# Patient Record
Sex: Male | Born: 1975 | Race: Black or African American | Hispanic: No | Marital: Married | Smoking: Never smoker
Health system: Southern US, Community
[De-identification: ages and names within clinical notes are randomized; demographics above are authoritative.]

## PROBLEM LIST (undated history)

## (undated) DIAGNOSIS — E119 Type 2 diabetes mellitus without complications: Secondary | ICD-10-CM

---

## 2000-03-10 ENCOUNTER — Encounter: Payer: Self-pay | Admitting: Emergency Medicine

## 2000-03-10 ENCOUNTER — Emergency Department (HOSPITAL_COMMUNITY): Admission: EM | Admit: 2000-03-10 | Discharge: 2000-03-10 | Payer: Self-pay | Admitting: Emergency Medicine

## 2000-03-18 ENCOUNTER — Emergency Department (HOSPITAL_COMMUNITY): Admission: EM | Admit: 2000-03-18 | Discharge: 2000-03-18 | Payer: Self-pay | Admitting: Emergency Medicine

## 2000-03-18 ENCOUNTER — Encounter: Payer: Self-pay | Admitting: Emergency Medicine

## 2008-06-26 ENCOUNTER — Emergency Department (HOSPITAL_COMMUNITY): Admission: EM | Admit: 2008-06-26 | Discharge: 2008-06-26 | Payer: Self-pay | Admitting: Emergency Medicine

## 2009-07-05 ENCOUNTER — Emergency Department (HOSPITAL_COMMUNITY)
Admission: EM | Admit: 2009-07-05 | Discharge: 2009-07-05 | Payer: Self-pay | Source: Home / Self Care | Admitting: Emergency Medicine

## 2009-07-12 ENCOUNTER — Emergency Department (HOSPITAL_COMMUNITY): Admission: EM | Admit: 2009-07-12 | Discharge: 2009-07-12 | Payer: Self-pay | Admitting: Emergency Medicine

## 2011-09-25 IMAGING — CR DG HAND COMPLETE 3+V*R*
3 series · 3 of 3 positions shown · non-contrast
Comparison: None

CLINICAL DATA: Laceration to right middle finger by metal

RIGHT HAND - COMPLETE 3+ VIEW

[x hand ap right]
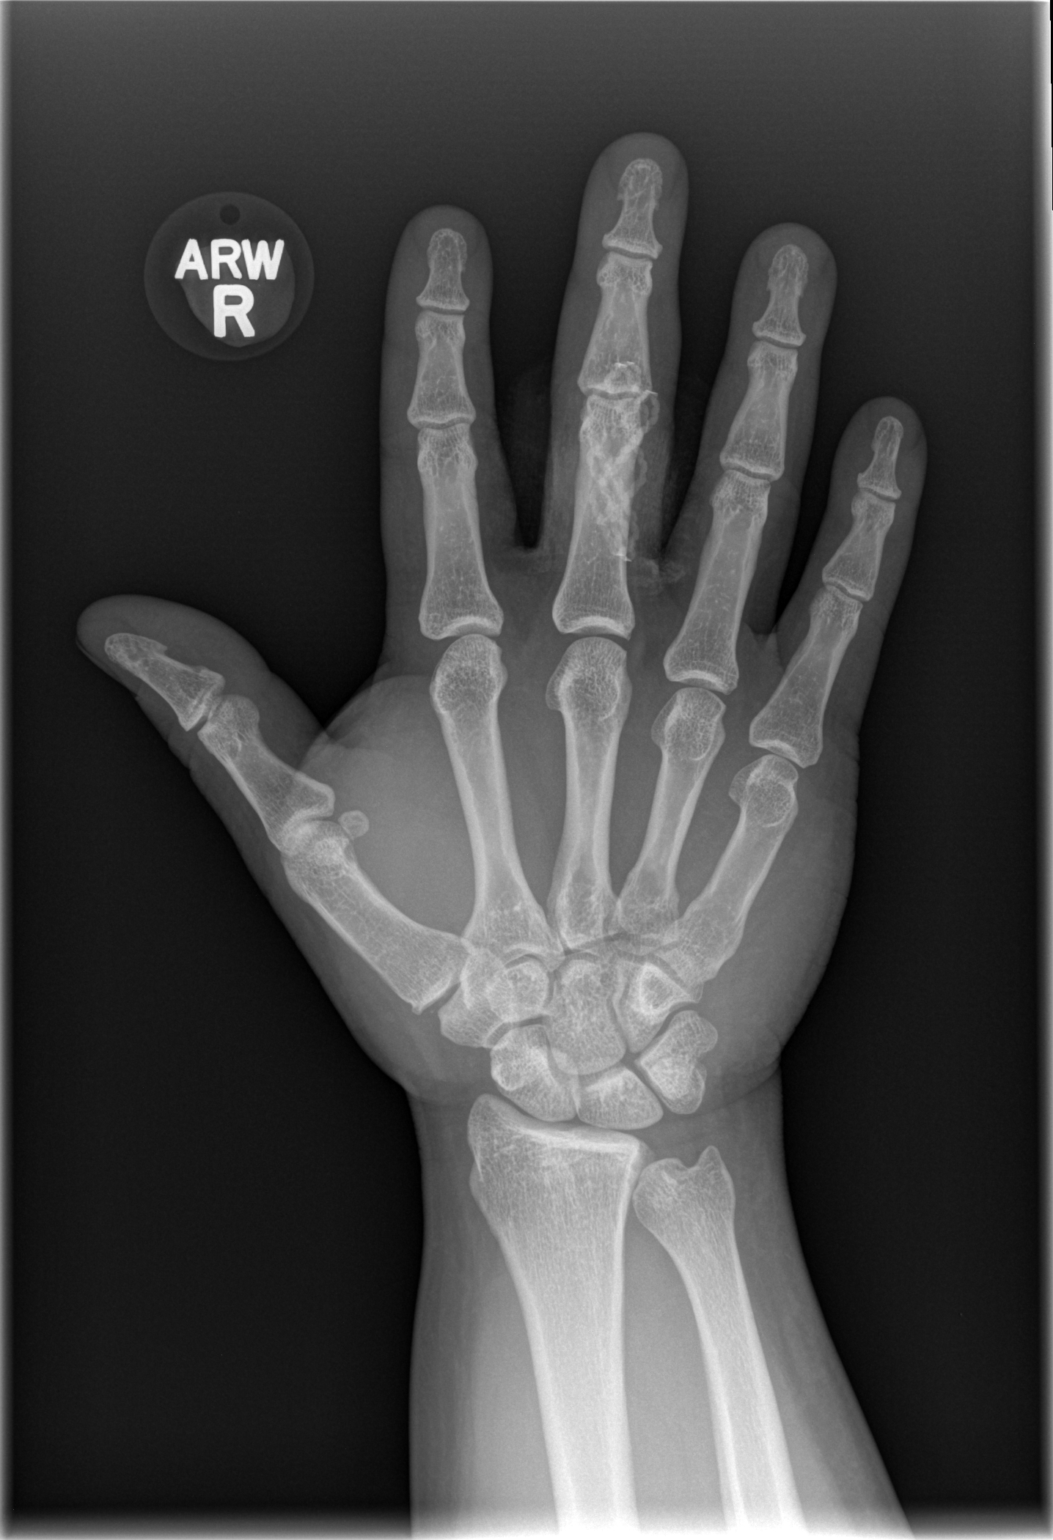

[x hand oblique right]
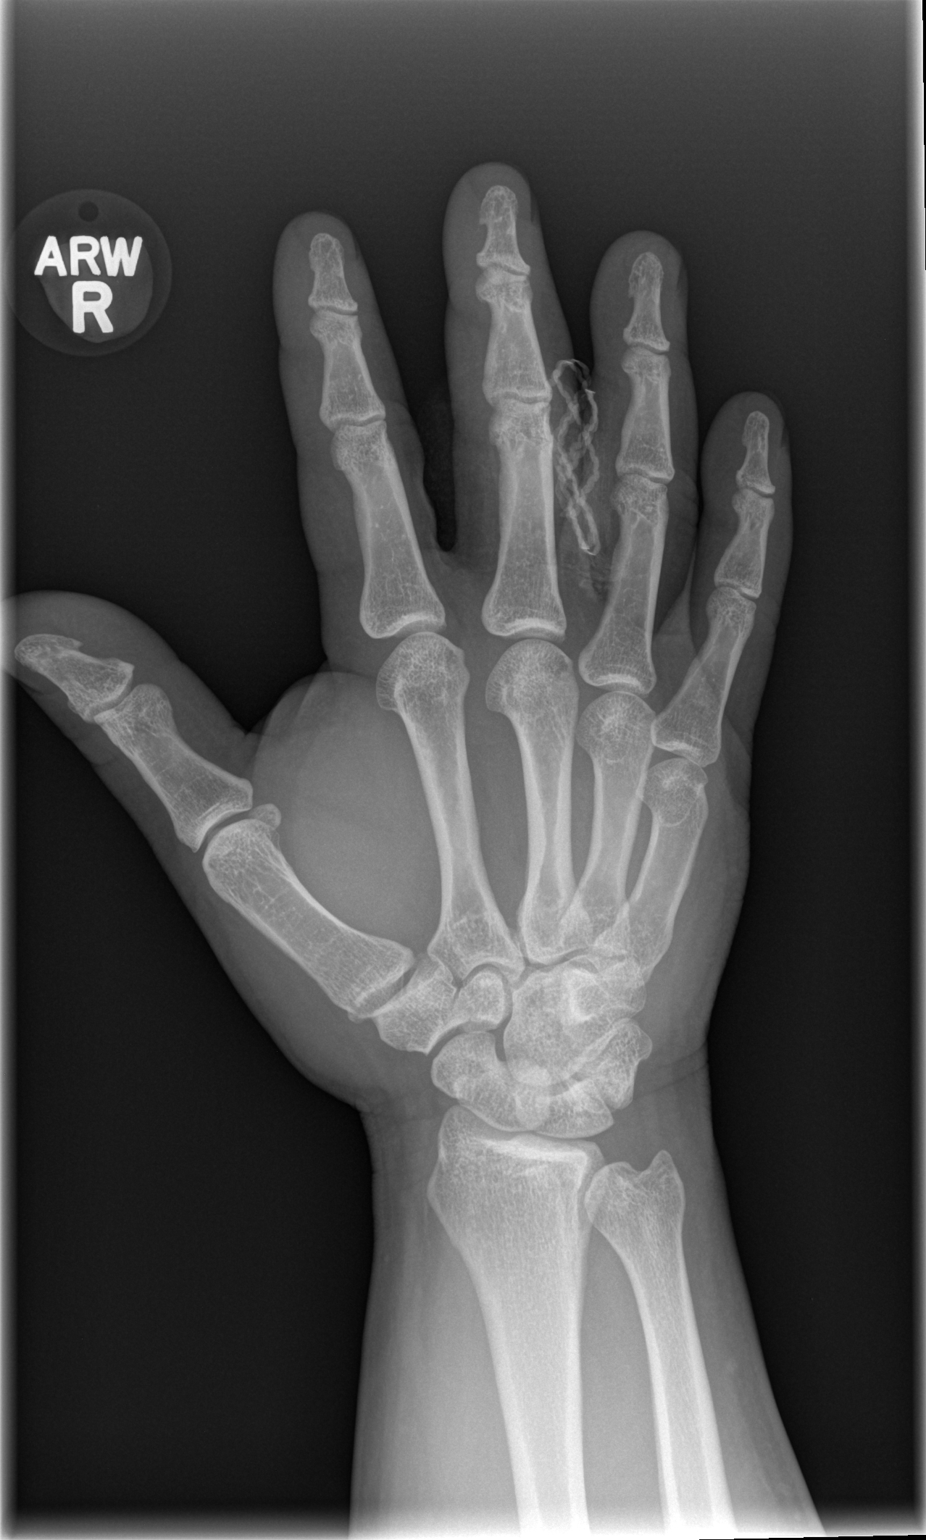

[x hand lat right]
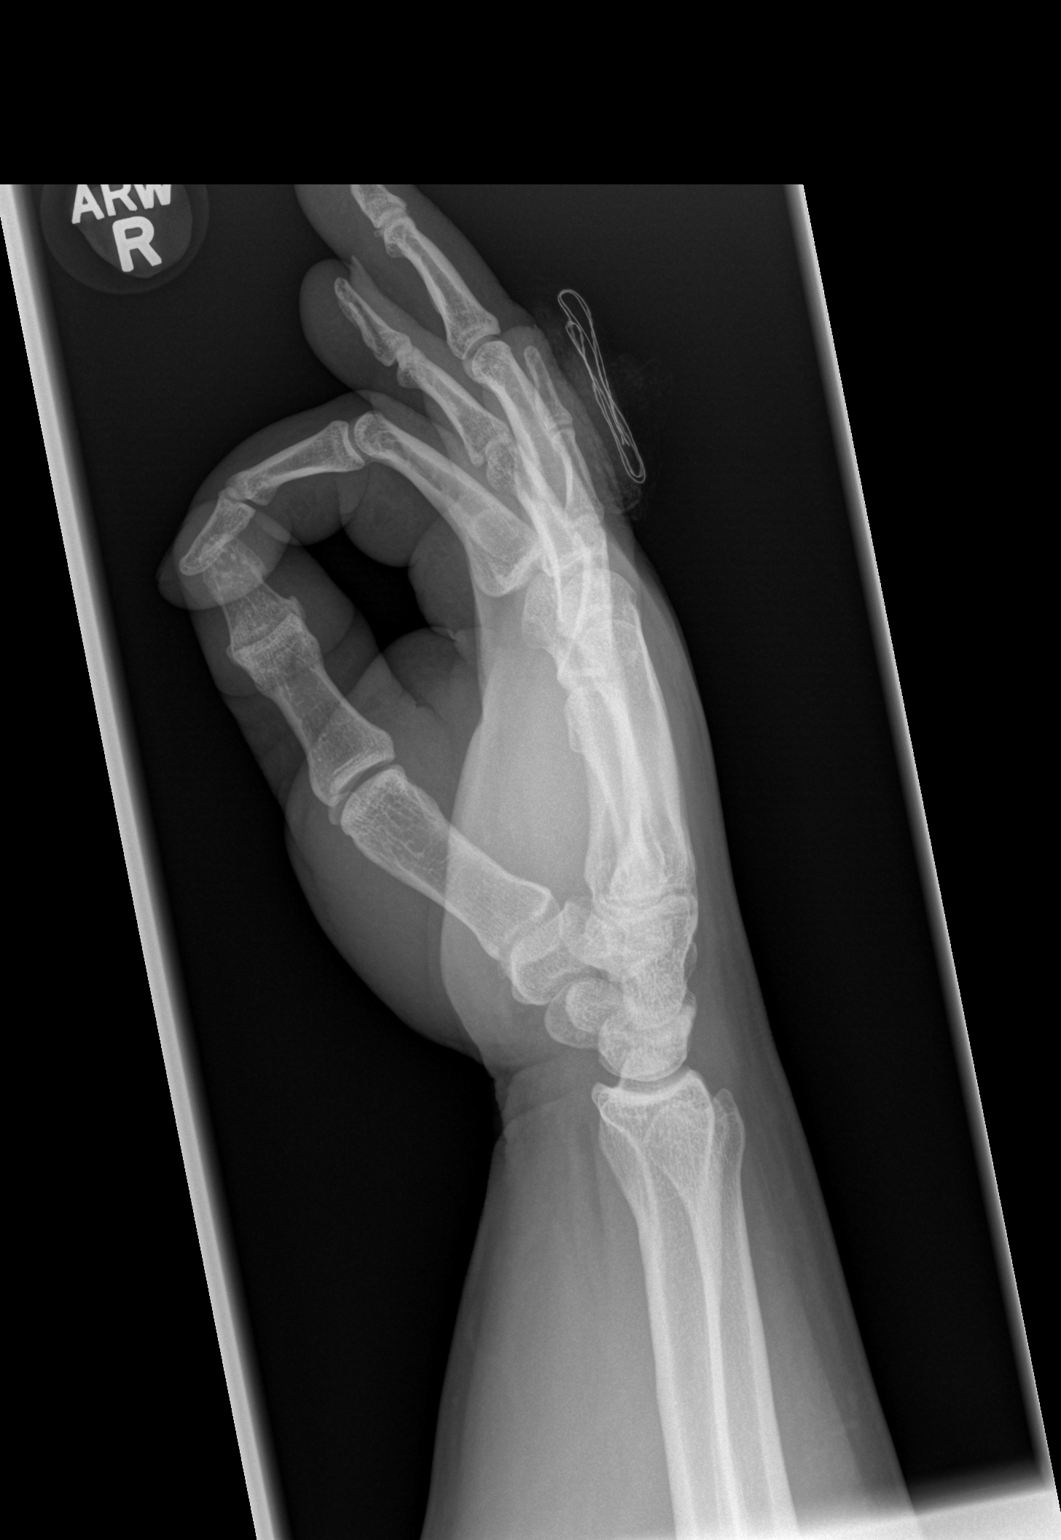

[3 of 3 positions shown; findings below may reference images not displayed]

FINDINGS: There is no evidence of fracture or dislocation.  There
is no evidence of arthropathy or other focal bone abnormality.
Bandage material overlies the soft tissues of the proximal and
middle third phalanx.
IMPRESSION: No acute bone abnormality.

## 2012-04-23 ENCOUNTER — Encounter (HOSPITAL_BASED_OUTPATIENT_CLINIC_OR_DEPARTMENT_OTHER): Payer: Self-pay | Admitting: *Deleted

## 2012-04-23 ENCOUNTER — Emergency Department (HOSPITAL_BASED_OUTPATIENT_CLINIC_OR_DEPARTMENT_OTHER)
Admission: EM | Admit: 2012-04-23 | Discharge: 2012-04-24 | Disposition: A | Discharge status: Home / Self Care | Payer: No Typology Code available for payment source | Attending: Emergency Medicine | Admitting: Emergency Medicine

## 2012-04-23 ENCOUNTER — Other Ambulatory Visit: Payer: Self-pay

## 2012-04-23 DIAGNOSIS — R0789 Other chest pain: Secondary | ICD-10-CM

## 2012-04-23 DIAGNOSIS — R071 Chest pain on breathing: Secondary | ICD-10-CM | POA: Insufficient documentation

## 2012-04-23 NOTE — ED Notes (Signed)
Right side chest pain x 3 days- states pain is sharp- pain had improved until dining this evening

## 2012-04-23 NOTE — ED Provider Notes (Signed)
Date: 04/23/2012 1050 P.M.  Rate:72  Rhythm: normal sinus rhythm  QRS Axis: normal  Intervals: normal  ST/T Wave abnormalities: normal  Conduction Disutrbances:none  Narrative Interpretation: Normal EKG  Old EKG Reviewed: none available    Carleene Cooper III, MD 04/23/12 2321

## 2012-04-24 ENCOUNTER — Emergency Department (HOSPITAL_BASED_OUTPATIENT_CLINIC_OR_DEPARTMENT_OTHER): Payer: No Typology Code available for payment source

## 2012-04-24 LAB — CBC WITH DIFFERENTIAL/PLATELET
Basophils Relative: 0 % (ref 0–1)
HCT: 39.8 % (ref 39.0–52.0)
Hemoglobin: 14.5 g/dL (ref 13.0–17.0)
Lymphs Abs: 2.3 10*3/uL (ref 0.7–4.0)
MCH: 25.6 pg — ABNORMAL LOW (ref 26.0–34.0)
MCHC: 36.4 g/dL — ABNORMAL HIGH (ref 30.0–36.0)
Monocytes Absolute: 0.6 10*3/uL (ref 0.1–1.0)
Monocytes Relative: 9 % (ref 3–12)
Neutro Abs: 3.8 10*3/uL (ref 1.7–7.7)
Neutrophils Relative %: 54 % (ref 43–77)
RBC: 5.67 MIL/uL (ref 4.22–5.81)

## 2012-04-24 LAB — BASIC METABOLIC PANEL
BUN: 18 mg/dL (ref 6–23)
CO2: 30 mEq/L (ref 19–32)
Chloride: 101 mEq/L (ref 96–112)
Creatinine, Ser: 1.3 mg/dL (ref 0.50–1.35)
GFR calc Af Amer: 80 mL/min — ABNORMAL LOW (ref >90)
Glucose, Bld: 79 mg/dL (ref 70–99)
Potassium: 3.7 mEq/L (ref 3.5–5.1)

## 2012-04-24 LAB — TROPONIN I: Troponin I: <0.3 ng/mL (ref <0.30)

## 2012-04-24 MED ORDER — KETOROLAC TROMETHAMINE 30 MG/ML IJ SOLN
30.0000 mg | Freq: Once | INTRAMUSCULAR | Status: AC
  Administered 2012-04-24: 30 mg via INTRAVENOUS
  Filled 2012-04-24: qty 1

## 2012-04-24 NOTE — ED Provider Notes (Signed)
History     CSN: [id number]  Arrival date & time 04/23/12  2202   First MD Initiated Contact with Patient 04/24/12 0033      Chief Complaint  Patient presents with  . Chest Pain    (Consider location/radiation/quality/duration/timing/severity/associated sxs/prior treatment) HPI This is a 37 year old male with no significant past medical history. He does not smoke and denies a family history of heart disease. He is here with a 3 to four-day history of chest pain. The pain is located just to the right of the anterior aspect of the sternum. It is well localized. It is a dull pain that sometimes becomes sharp. It has been intermittent. It gets worse with movement. There is no associated shortness of breath, nausea or diaphoresis. It is not pleuritic. He denies pain or swelling in his legs.  History reviewed. No pertinent past medical history.  History reviewed. No pertinent past surgical history.  No family history on file.  History  Substance Use Topics  . Smoking status: Never Smoker   . Smokeless tobacco: Never Used  . Alcohol Use: 1.2 oz/week    1 Cans of beer, 1 Glasses of wine per week      Review of Systems  All other systems reviewed and are negative.    Allergies  Review of patient's allergies indicates no known allergies.  Home Medications  No current outpatient prescriptions on file.  BP 128/71  Pulse 76  Temp(Src) 98 F (36.7 C) (Oral)  Resp 16  Ht 5\' 7"  (1.702 m)  Wt 250 lb (113.399 kg)  BMI 39.15 kg/m2  SpO2 96%  Physical Exam General: Well-developed, well-nourished male in no acute distress; appearance consistent with age of record HENT: normocephalic, atraumatic Eyes: pupils equal round and reactive to light; extraocular muscles intact Neck: supple Heart: regular rate and rhythm; no murmurs, rubs or gallops Lungs: clear to auscultation bilaterally Chest: Mild tenderness to the right of the superior aspect of the sternum without crepitus or  mass Abdomen: soft; nondistended; nontender; no masses or hepatosplenomegaly; bowel sounds present Extremities: No deformity; full range of motion Neurologic: Awake, alert and oriented; motor function intact in all extremities and symmetric; no facial droop Skin: Warm and dry Psychiatric: Normal mood and affect    ED Course  Procedures (including critical care time)     MDM   Nursing notes and vitals signs, including pulse oximetry, reviewed.  Summary of this visit's results, reviewed by myself:  Labs:  Results for orders placed during the hospital encounter of 04/23/12 (from the past 24 hour(s))  TROPONIN I     Status: None   Collection Time    04/24/12 12:48 AM      Result Value Range   Troponin I <0.30  <0.30 ng/mL  CBC WITH DIFFERENTIAL     Status: Abnormal   Collection Time    04/24/12 12:48 AM      Result Value Range   WBC 6.9  4.0 - 10.5 K/uL   RBC 5.67  4.22 - 5.81 MIL/uL   Hemoglobin 14.5  13.0 - 17.0 g/dL   HCT 30.8  65.7 - 84.6 %   MCV 70.2 (*) 78.0 - 100.0 fL   MCH 25.6 (*) 26.0 - 34.0 pg   MCHC 36.4 (*) 30.0 - 36.0 g/dL   RDW 96.2 (*) 95.2 - 84.1 %   Platelets 294  150 - 400 K/uL   Neutrophils Relative 54  43 - 77 %   Neutro Abs 3.8  1.7 -  7.7 K/uL   Lymphocytes Relative 33  12 - 46 %   Lymphs Abs 2.3  0.7 - 4.0 K/uL   Monocytes Relative 9  3 - 12 %   Monocytes Absolute 0.6  0.1 - 1.0 K/uL   Eosinophils Relative 4  0 - 5 %   Eosinophils Absolute 0.2  0.0 - 0.7 K/uL   Basophils Relative 0  0 - 1 %   Basophils Absolute 0.0  0.0 - 0.1 K/uL  BASIC METABOLIC PANEL     Status: Abnormal   Collection Time    04/24/12 12:48 AM      Result Value Range   Sodium 139  135 - 145 mEq/L   Potassium 3.7  3.5 - 5.1 mEq/L   Chloride 101  96 - 112 mEq/L   CO2 30  19 - 32 mEq/L   Glucose, Bld 79  70 - 99 mg/dL   BUN 18  6 - 23 mg/dL   Creatinine, Ser 1.61  0.50 - 1.35 mg/dL   Calcium 9.4  8.4 - 09.6 mg/dL   GFR calc non Af Amer 69 (*) >90 mL/min   GFR calc Af  Amer 80 (*) >90 mL/min    Imaging Studies: Dg Chest 2 View  04/24/2012  *RADIOLOGY REPORT*  Clinical Data: Right-sided chest pain 3 days.  CHEST - 2 VIEW  Comparison: None.  Findings: The lungs are hypoexpanded but appear grossly clear. There is no evidence of focal opacification, pleural effusion or pneumothorax.  The heart is borderline normal in size; the mediastinal contour is within normal limits.  No acute osseous abnormalities are seen.  IMPRESSION: Hypoexpanded but grossly clear lungs.  No displaced rib fractures seen.   Original Report Authenticated By: Tonia Ghent, M.D.             Hanley Seamen, MD 04/24/12 0140

## 2012-04-24 NOTE — ED Notes (Signed)
MD at bedside giving test results and plan of care. 

## 2012-04-24 NOTE — ED Notes (Signed)
Patient transported to X-ray 

## 2013-01-19 ENCOUNTER — Ambulatory Visit (INDEPENDENT_AMBULATORY_CARE_PROVIDER_SITE_OTHER): Payer: PRIVATE HEALTH INSURANCE

## 2013-01-19 ENCOUNTER — Ambulatory Visit (INDEPENDENT_AMBULATORY_CARE_PROVIDER_SITE_OTHER): Payer: PRIVATE HEALTH INSURANCE | Admitting: Podiatry

## 2013-01-19 ENCOUNTER — Encounter: Payer: Self-pay | Admitting: Podiatry

## 2013-01-19 VITALS — BP 126/80 | HR 68 | Resp 20 | Ht 68.0 in | Wt 250.0 lb

## 2013-01-19 DIAGNOSIS — R52 Pain, unspecified: Secondary | ICD-10-CM

## 2013-01-19 DIAGNOSIS — M779 Enthesopathy, unspecified: Secondary | ICD-10-CM

## 2013-01-19 MED ORDER — PREDNISONE 10 MG PO KIT: PACK | ORAL | Status: DC

## 2013-01-19 NOTE — Patient Instructions (Signed)
Stop the Naprosyn when taking the prednisone. If the foot pain continues return for scanning for custom orthotics.

## 2013-01-19 NOTE — Progress Notes (Signed)
   Subjective:    Patient ID: Scott Novak, male    DOB: 1975/06/10, 38 y.o.   MRN: [id number] "My right foot under my pinkie toe and the toe beside it is always swollen under the bottom."   Foot Pain This is a new (PAIN 4TH/5TH MET. AREA RIGHT) problem. Episode onset: 1 MONTH. The problem occurs every several days. The problem has been gradually worsening. Associated symptoms comments: SWELLING. The symptoms are aggravated by walking. He has tried NSAIDs (NAPROXEN) for the symptoms. The treatment provided mild relief.      Review of Systems  Musculoskeletal: Positive for gait problem.  Skin:       CHANGE IN NAILS  All other systems reviewed and are negative.       Objective:   Physical Exam  Orientated x25 38 year old black male  Vascular: The DP and PT pulses are two over four bilaterally  Neurological: Knee and ankle reflexes are trace reactive bilaterally  Dermatological: Low-grade edema plantar fourth MPJ noted right. Deformed, elongated toenails x10  Musculoskeletal: Palpable tenderness plantar fourth right MPJ without a palpable lesions. Low-grade tenderness on range of motion of the fourth right MPJ. This is the area of maximum discomfort.  X-ray report weightbearing left foot  Intact bony structure without a fracture and/or dislocation noted. Small posterior and inferior heel spurs noted. Joint spaces are unremarkable with special attention to the metatarsophalangeal joints.   Radiographic impression: No acute bony abnormality noted     Assessment & Plan:    Assessment: Capsulitis of the fourth right metatarsal phalangeal joint  Plan: Rx six-day 10 mg tapering prednisone dose pack. Patient advised to DC naproxen when taking prednisone. He will ambulate in an  athletic shoe with a soft pad. If the symptoms persist I advised to return and would consider a custom orthotic with a deep pocket to offload the fourth right MPJ.

## 2014-07-15 IMAGING — CR DG CHEST 2V
2 series · 2 of 2 positions shown · non-contrast
Comparison: None.

CLINICAL DATA: Right-sided chest pain 3 days.

CHEST - 2 VIEW

[w chest pa]
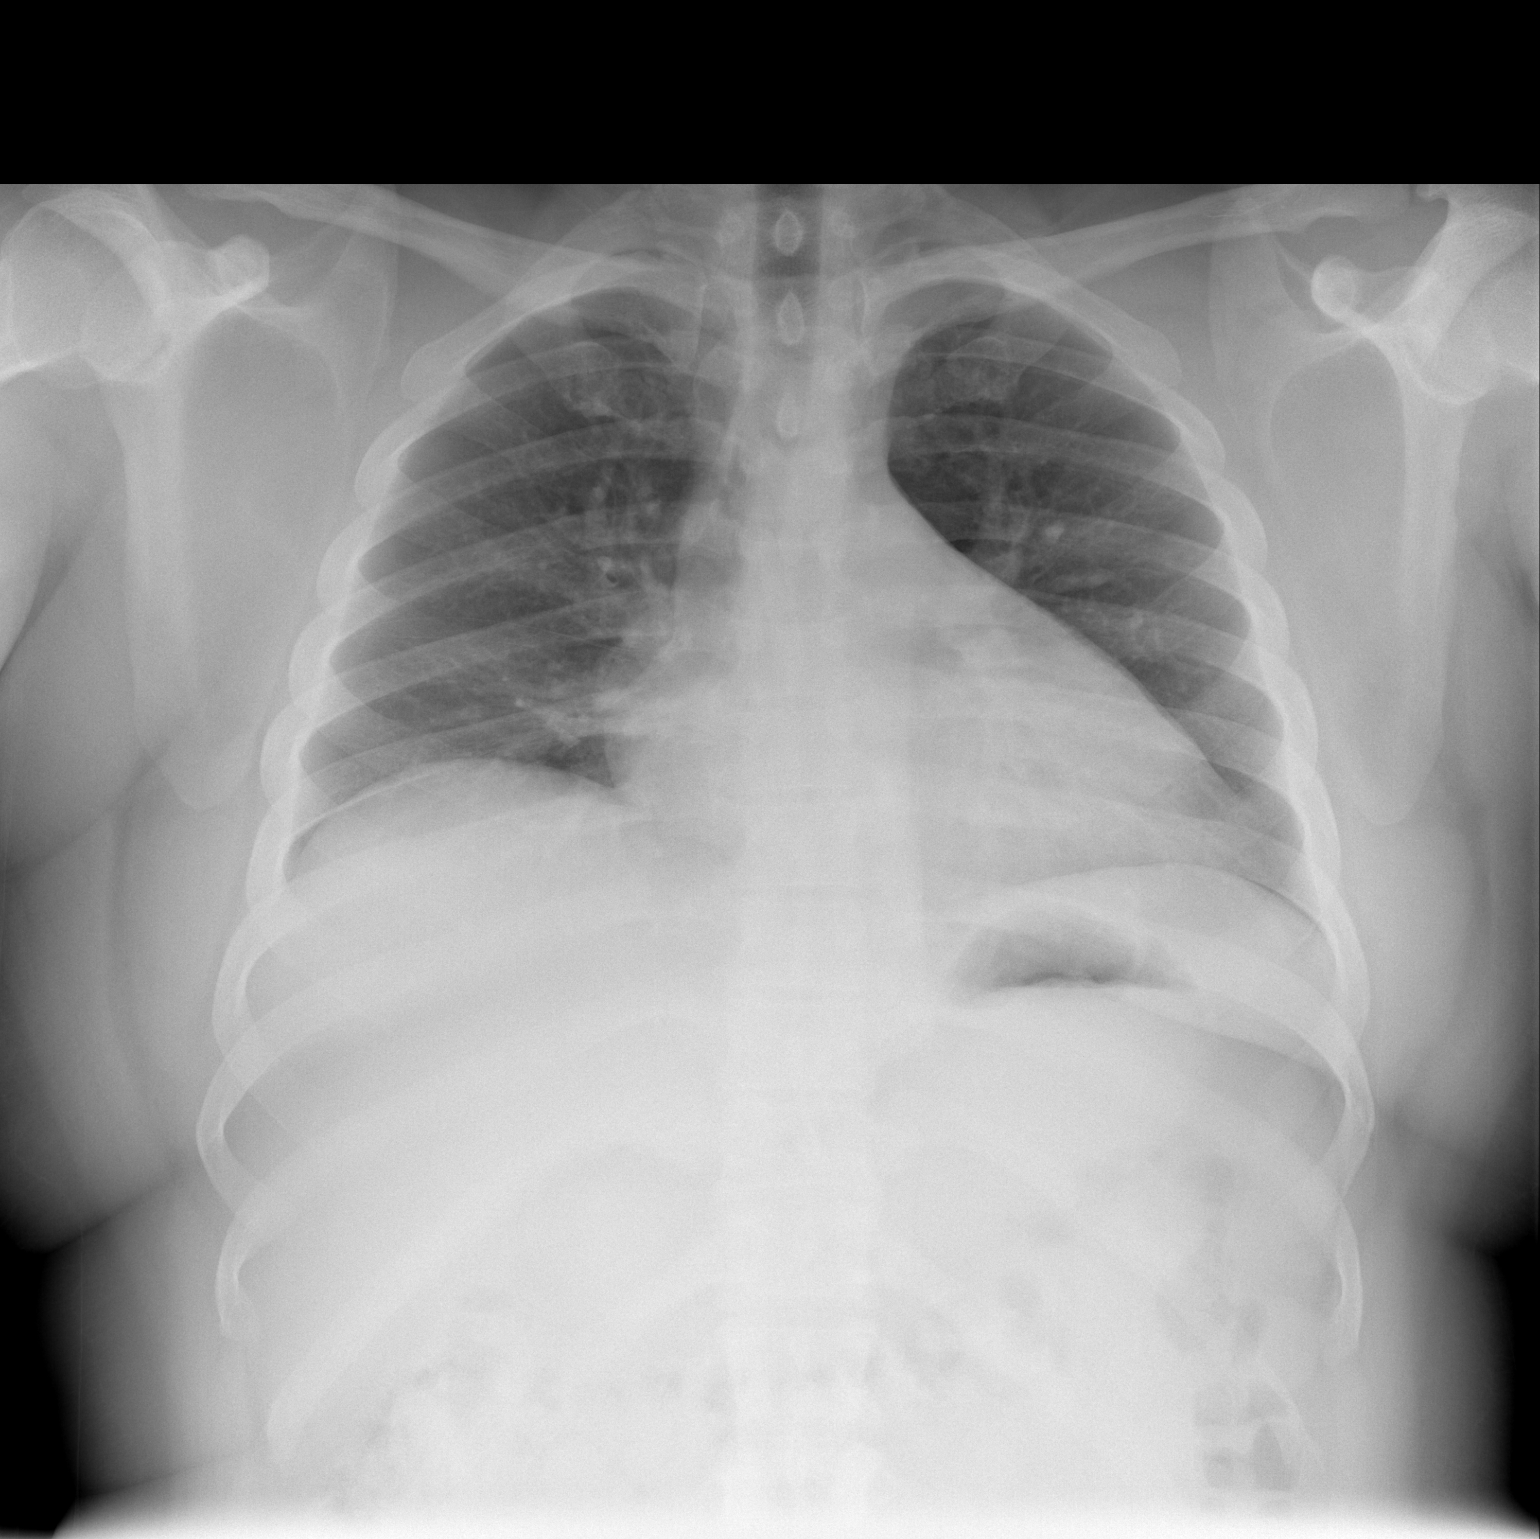

[w chest lat]
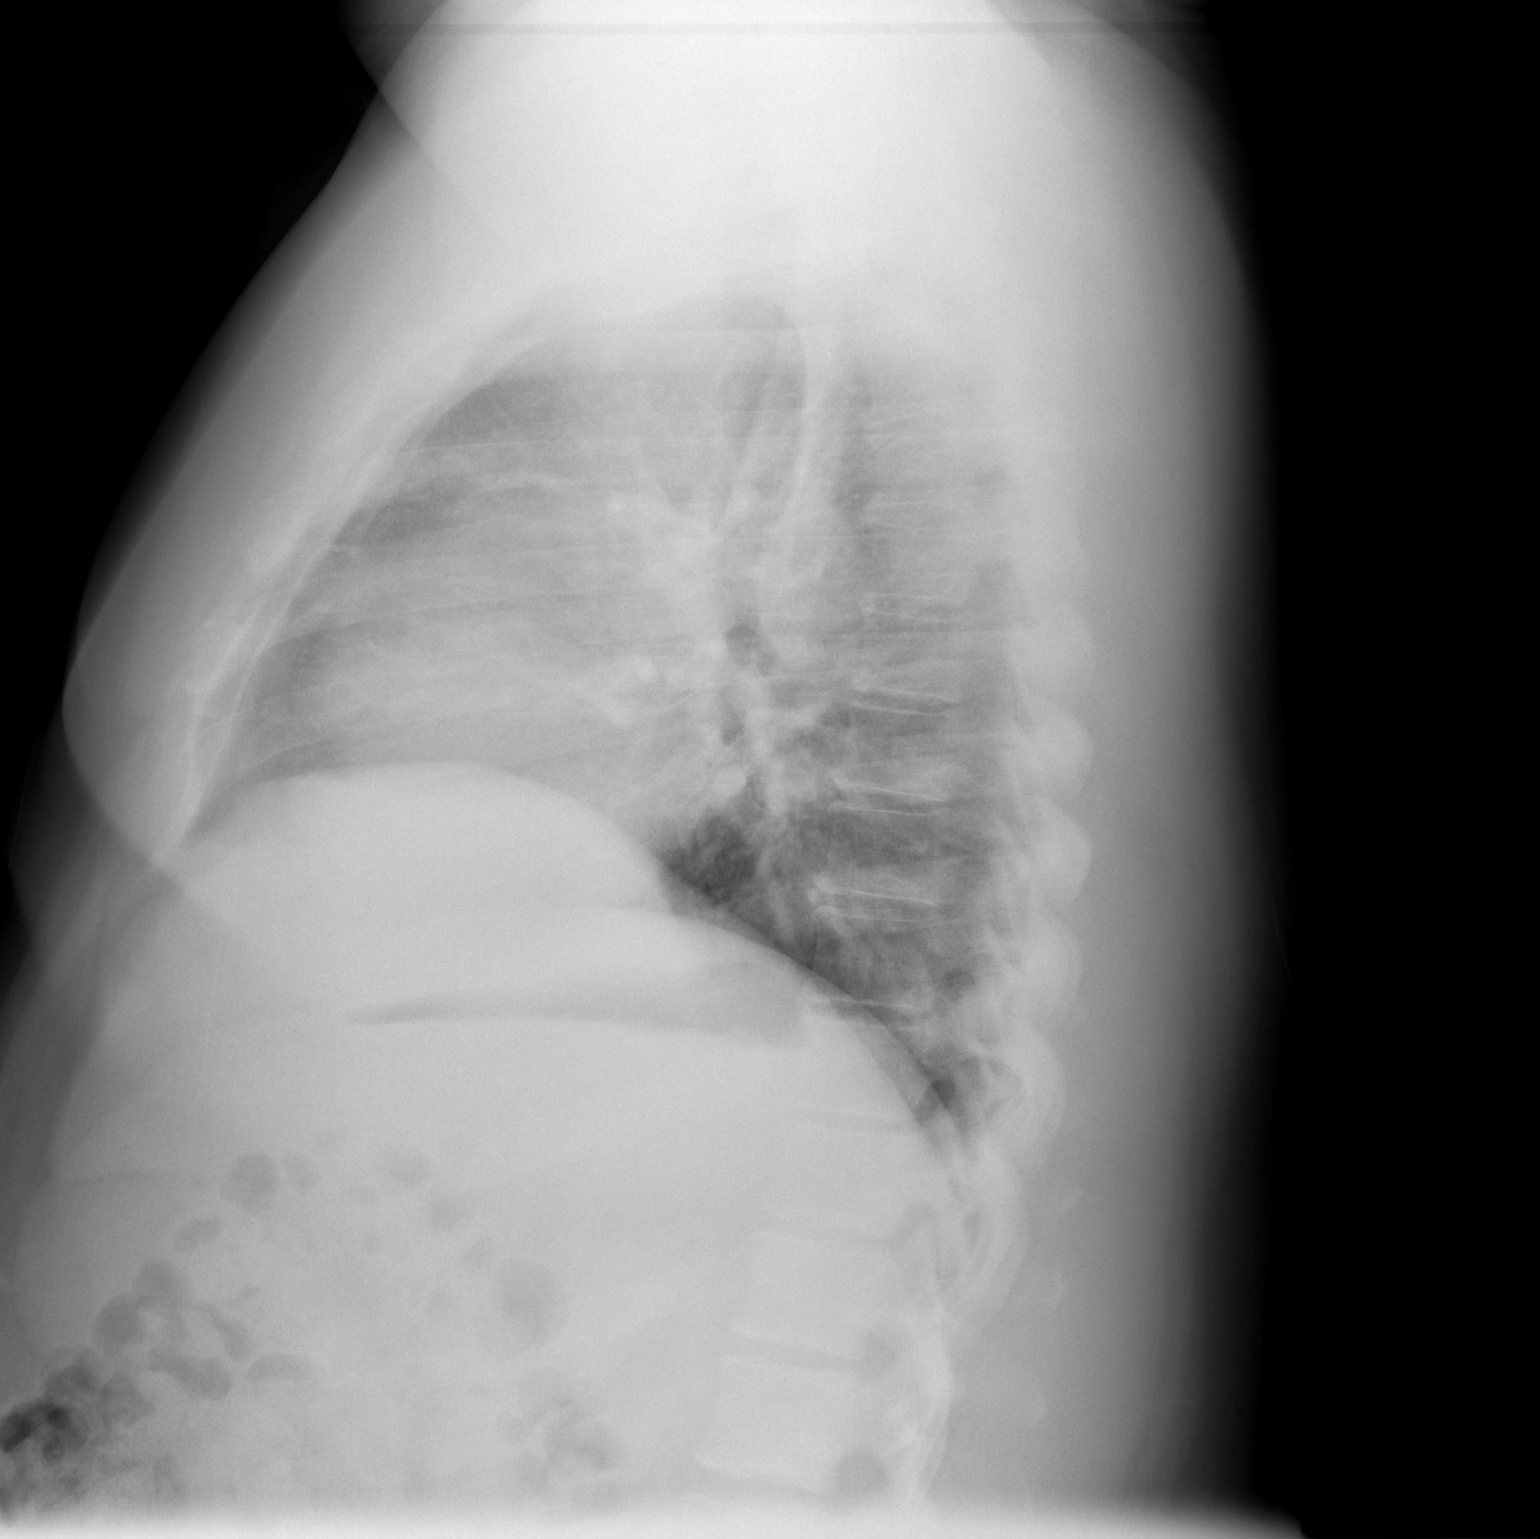

[2 of 2 positions shown; findings below may reference images not displayed]

FINDINGS: The lungs are hypoexpanded but appear grossly clear.
There is no evidence of focal opacification, pleural effusion or
pneumothorax.

The heart is borderline normal in size; the mediastinal contour is
within normal limits.  No acute osseous abnormalities are seen.
IMPRESSION: Hypoexpanded but grossly clear lungs.  No displaced rib fractures
seen.

## 2016-03-04 ENCOUNTER — Emergency Department (HOSPITAL_COMMUNITY)
Admission: EM | Admit: 2016-03-04 | Discharge: 2016-03-04 | Disposition: A | Discharge status: Home / Self Care | Payer: BLUE CROSS/BLUE SHIELD | Attending: Emergency Medicine | Admitting: Emergency Medicine

## 2016-03-04 ENCOUNTER — Encounter (HOSPITAL_COMMUNITY): Payer: Self-pay | Admitting: Emergency Medicine

## 2016-03-04 DIAGNOSIS — E1165 Type 2 diabetes mellitus with hyperglycemia: Secondary | ICD-10-CM | POA: Insufficient documentation

## 2016-03-04 DIAGNOSIS — Z7984 Long term (current) use of oral hypoglycemic drugs: Secondary | ICD-10-CM | POA: Diagnosis not present

## 2016-03-04 DIAGNOSIS — R739 Hyperglycemia, unspecified: Secondary | ICD-10-CM

## 2016-03-04 HISTORY — DX: Type 2 diabetes mellitus without complications: E11.9

## 2016-03-04 LAB — CBG MONITORING, ED
GLUCOSE-CAPILLARY: 348 mg/dL — AB (ref 65–99)
GLUCOSE-CAPILLARY: 324 mg/dL — AB (ref 65–99)
Glucose-Capillary: 404 mg/dL — ABNORMAL HIGH (ref 65–99)

## 2016-03-04 LAB — CBC
HEMATOCRIT: 44.2 % (ref 39.0–52.0)
HEMOGLOBIN: 16 g/dL (ref 13.0–17.0)
MCH: 24.9 pg — AB (ref 26.0–34.0)
MCHC: 36.2 g/dL — AB (ref 30.0–36.0)
MCV: 68.7 fL — AB (ref 78.0–100.0)
Platelets: 288 10*3/uL (ref 150–400)
RBC: 6.43 MIL/uL — ABNORMAL HIGH (ref 4.22–5.81)
RDW: 14.9 % (ref 11.5–15.5)
WBC: 6.4 10*3/uL (ref 4.0–10.5)

## 2016-03-04 LAB — BASIC METABOLIC PANEL
ANION GAP: 10 (ref 5–15)
BUN: 17 mg/dL (ref 6–20)
CHLORIDE: 98 mmol/L — AB (ref 101–111)
CO2: 25 mmol/L (ref 22–32)
Calcium: 9.3 mg/dL (ref 8.9–10.3)
Creatinine, Ser: 1.15 mg/dL (ref 0.61–1.24)
GFR calc Af Amer: >60 mL/min (ref >60)
GFR calc non Af Amer: >60 mL/min (ref >60)
GLUCOSE: 389 mg/dL — AB (ref 65–99)
POTASSIUM: 4.6 mmol/L (ref 3.5–5.1)
Sodium: 133 mmol/L — ABNORMAL LOW (ref 135–145)

## 2016-03-04 LAB — URINALYSIS, ROUTINE W REFLEX MICROSCOPIC
BACTERIA UA: NONE SEEN
BILIRUBIN URINE: NEGATIVE
Glucose, UA: >=500 mg/dL — AB
Hgb urine dipstick: NEGATIVE
Ketones, ur: 20 mg/dL — AB
Leukocytes, UA: NEGATIVE
Nitrite: NEGATIVE
PH: 5 (ref 5.0–8.0)
Protein, ur: NEGATIVE mg/dL
RBC / HPF: NONE SEEN RBC/hpf (ref 0–5)
SPECIFIC GRAVITY, URINE: 1.031 — AB (ref 1.005–1.030)
SQUAMOUS EPITHELIAL / LPF: NONE SEEN

## 2016-03-04 LAB — HEPATIC FUNCTION PANEL
ALBUMIN: 4.3 g/dL (ref 3.5–5.0)
ALK PHOS: 122 U/L (ref 38–126)
ALT: 60 U/L (ref 17–63)
AST: 31 U/L (ref 15–41)
BILIRUBIN INDIRECT: 1.1 mg/dL — AB (ref 0.3–0.9)
Bilirubin, Direct: 0.1 mg/dL (ref 0.1–0.5)
Total Bilirubin: 1.2 mg/dL (ref 0.3–1.2)
Total Protein: 7.9 g/dL (ref 6.5–8.1)

## 2016-03-04 MED ORDER — METFORMIN HCL 500 MG PO TABS: 500.0000 mg | ORAL_TABLET | Freq: Two times a day (BID) | ORAL | 0 refills | Status: AC

## 2016-03-04 MED ORDER — SODIUM CHLORIDE 0.9 % IV BOLUS (SEPSIS)
1000.0000 mL | Freq: Once | INTRAVENOUS | Status: AC
  Administered 2016-03-04: 1000 mL via INTRAVENOUS

## 2016-03-04 MED ORDER — METFORMIN HCL 500 MG PO TABS
500.0000 mg | ORAL_TABLET | Freq: Once | ORAL | Status: AC
  Administered 2016-03-04: 500 mg via ORAL
  Filled 2016-03-04: qty 1

## 2016-03-04 NOTE — ED Provider Notes (Signed)
[facility]-EMERGENCY DEPT Provider Note   CSN: [id number] Arrival date & time: 03/04/16  1129     History   Chief Complaint Chief Complaint  Patient presents with  . Hyperglycemia    HPI Scott Novak is a 41 y.o. male.  The history is provided by the patient.  Hyperglycemia  Blood sugar level PTA:  360 Onset quality: noted today. Timing:  Unable to specify Chronicity:  New Diabetes status:  Unable to specify Current diabetic therapy:  None Context: new diabetes diagnosis   Relieved by:  None tried Associated symptoms: abdominal pain (mild RUQ; exacerbated with spicy foods), increased thirst and polyuria   Associated symptoms: no chest pain and no shortness of breath   Risk factors: family hx of diabetes     Past Medical History:  Diagnosis Date  . Diabetes mellitus without complication (HCC)     There are no active problems to display for this patient.   History reviewed. No pertinent surgical history.     Home Medications    Prior to Admission medications   Medication Sig Start Date End Date Taking? Authorizing Provider  metFORMIN (GLUCOPHAGE) 500 MG tablet Take 1 tablet (500 mg total) by mouth 2 (two) times daily with a meal. 03/04/16 04/03/16  Nira Conn, MD    Family History History reviewed. No pertinent family history.  Social History Social History  Substance Use Topics  . Smoking status: Never Smoker  . Smokeless tobacco: Never Used  . Alcohol use 1.2 oz/week    1 Cans of beer, 1 Glasses of wine per week     Comment: occasionally     Allergies   Patient has no known allergies.   Review of Systems Review of Systems  Respiratory: Negative for shortness of breath.   Cardiovascular: Negative for chest pain.  Gastrointestinal: Positive for abdominal pain (mild RUQ; exacerbated with spicy foods).  Endocrine: Positive for polydipsia and polyuria.  Ten systems are reviewed and are negative for acute change except as noted in the  HPI    Physical Exam Updated Vital Signs BP 145/72 (BP Location: Left Arm)   Pulse 83   Temp 98.4 F (36.9 C) (Oral)   Resp 18   SpO2 97%   Physical Exam  Constitutional: He is oriented to person, place, and time. He appears well-developed and well-nourished. No distress.  HENT:  Head: Normocephalic and atraumatic.  Nose: Nose normal.  Eyes: Conjunctivae and EOM are normal. Pupils are equal, round, and reactive to light. Right eye exhibits no discharge. Left eye exhibits no discharge. No scleral icterus.  Neck: Normal range of motion. Neck supple.  Cardiovascular: Normal rate and regular rhythm.  Exam reveals no gallop and no friction rub.   No murmur heard. Pulmonary/Chest: Effort normal and breath sounds normal. No stridor. No respiratory distress. He has no rales.  Abdominal: Soft. He exhibits no distension. There is no tenderness.  Musculoskeletal: He exhibits no edema or tenderness.  Neurological: He is alert and oriented to person, place, and time.  Skin: Skin is warm and dry. No rash noted. He is not diaphoretic. No erythema.  Psychiatric: He has a normal mood and affect.  Vitals reviewed.    ED Treatments / Results  Labs (all labs ordered are listed, but only abnormal results are displayed) Labs Reviewed  BASIC METABOLIC PANEL - Abnormal; Notable for the following:       Result Value   Sodium 133 (*)    Chloride 98 (*)  Glucose, Bld 389 (*)    All other components within normal limits  CBC - Abnormal; Notable for the following:    RBC 6.43 (*)    MCV 68.7 (*)    MCH 24.9 (*)    MCHC 36.2 (*)    All other components within normal limits  URINALYSIS, ROUTINE W REFLEX MICROSCOPIC - Abnormal; Notable for the following:    Color, Urine STRAW (*)    Specific Gravity, Urine 1.031 (*)    Glucose, UA >=500 (*)    Ketones, ur 20 (*)    All other components within normal limits  HEPATIC FUNCTION PANEL - Abnormal; Notable for the following:    Indirect Bilirubin  1.1 (*)    All other components within normal limits  CBG MONITORING, ED - Abnormal; Notable for the following:    Glucose-Capillary 404 (*)    All other components within normal limits  CBG MONITORING, ED - Abnormal; Notable for the following:    Glucose-Capillary 348 (*)    All other components within normal limits  CBG MONITORING, ED - Abnormal; Notable for the following:    Glucose-Capillary 324 (*)    All other components within normal limits    EKG  EKG Interpretation None       Radiology No results found.  Procedures Procedures (including critical care time)  Medications Ordered in ED Medications  metFORMIN (GLUCOPHAGE) tablet 500 mg (not administered)  sodium chloride 0.9 % bolus 1,000 mL (0 mLs Intravenous Stopped 03/04/16 1349)     Initial Impression / Assessment and Plan / ED Course  I have reviewed the triage vital signs and the nursing notes.  Pertinent labs & imaging results that were available during my care of the patient were reviewed by me and considered in my medical decision making (see chart for details).     Likely new-onset diabetes. No evidence of recent infections or steroid use that would help for patient's hyperglycemia. Labs without evidence of diabetic ketoacidosis. Provided with IV fluids which lowered his blood sugar levels to 324. Provided first dose of metformin in the ED. Patient already has close follow-up with his primary care provider for continued workup and management.  The patient is safe for discharge with strict return precautions.   Final Clinical Impressions(s) / ED Diagnoses   Final diagnoses:  Hyperglycemia   Disposition: Discharge  Condition: Good  I have discussed the results, Dx and Tx plan with the patient who expressed understanding and agree(s) with the plan. Discharge instructions discussed at great length. The patient was given strict return precautions who verbalized understanding of the instructions. No  further questions at time of discharge.    New Prescriptions   METFORMIN (GLUCOPHAGE) 500 MG TABLET    Take 1 tablet (500 mg total) by mouth 2 (two) times daily with a meal.    Follow Up: primary care provider   as scheduled for this week      Nira ConnPedro Eduardo Cardama, MD 03/04/16 [phone]

## 2016-03-04 NOTE — ED Triage Notes (Signed)
Patient states he was sent from PCP to be evaluated for new diagnosis diabetes with concern for DKA. Reports fasting CBG at PCP was 366. Patient reports periods of abdominal pain and blurred vision over the past two weeks but denies these sx at this time. Ambulatory to triage.

## 2019-03-31 ENCOUNTER — Ambulatory Visit: Payer: BC Managed Care – PPO | Attending: Internal Medicine

## 2019-03-31 DIAGNOSIS — Z23 Encounter for immunization: Secondary | ICD-10-CM

## 2019-03-31 NOTE — Progress Notes (Signed)
   Covid-19 Vaccination Clinic  Name:  Scott Novak    MRN: [id number] DOB: October 24, 1975  03/31/2019  Mr. Gunther was observed post Covid-19 immunization for 15 minutes without incident. He was provided with Vaccine Information Sheet and instruction to access the V-Safe system.   Mr. Westfall was instructed to call 911 with any severe reactions post vaccine: Marland Kitchen Difficulty breathing  . Swelling of face and throat  . A fast heartbeat  . A bad rash all over body  . Dizziness and weakness   Immunizations Administered    Name Date Dose VIS Date Route   Pfizer COVID-19 Vaccine 03/31/2019  8:30 AM 0.3 mL 12/17/2018 Intramuscular   Manufacturer: [employer], [employer]   Lot: [id number]   NDC: [address]-8

## 2019-04-25 ENCOUNTER — Ambulatory Visit: Payer: BC Managed Care – PPO | Attending: Internal Medicine

## 2019-04-25 DIAGNOSIS — Z23 Encounter for immunization: Secondary | ICD-10-CM

## 2019-04-25 NOTE — Progress Notes (Signed)
   Covid-19 Vaccination Clinic  Name:  Scott Novak    MRN: [id number] DOB: November 14, 1975  04/25/2019  [name] was observed post Covid-19 immunization for 15 minutes without incident. He was provided with Vaccine Information Sheet and instruction to access the V-Safe system.   [name] was instructed to call 911 with any severe reactions post vaccine: Marland Kitchen Difficulty breathing  . Swelling of face and throat  . A fast heartbeat  . A bad rash all over body  . Dizziness and weakness   Immunizations Administered    Name Date Dose VIS Date Route   Pfizer COVID-19 Vaccine 04/25/2019  8:33 AM 0.3 mL 03/02/2018 Intramuscular   Manufacturer: [employer], [employer]   Lot: [id number]   NDC: [address]-4

## 2019-05-14 ENCOUNTER — Ambulatory Visit: Payer: BLUE CROSS/BLUE SHIELD

## 2022-11-24 LAB — COLOGUARD: COLOGUARD: NEGATIVE

## 2024-01-03 ENCOUNTER — Emergency Department (HOSPITAL_BASED_OUTPATIENT_CLINIC_OR_DEPARTMENT_OTHER)
Admission: EM | Admit: 2024-01-03 | Discharge: 2024-01-03 | Disposition: A | Discharge status: Home / Self Care | Attending: Emergency Medicine | Admitting: Emergency Medicine

## 2024-01-03 ENCOUNTER — Other Ambulatory Visit: Payer: Self-pay

## 2024-01-03 ENCOUNTER — Encounter (HOSPITAL_BASED_OUTPATIENT_CLINIC_OR_DEPARTMENT_OTHER): Payer: Self-pay | Admitting: Emergency Medicine

## 2024-01-03 DIAGNOSIS — M545 Low back pain, unspecified: Secondary | ICD-10-CM | POA: Diagnosis present

## 2024-01-03 DIAGNOSIS — I10 Essential (primary) hypertension: Secondary | ICD-10-CM

## 2024-01-03 DIAGNOSIS — M5441 Lumbago with sciatica, right side: Secondary | ICD-10-CM | POA: Insufficient documentation

## 2024-01-03 MED ORDER — METHYLPREDNISOLONE 4 MG PO TBPK: ORAL_TABLET | ORAL | 0 refills | Status: AC

## 2024-01-03 NOTE — Discharge Instructions (Addendum)
 Please wear the your blood pressure was higher than normal today in the ER. Follow up with your primary care doctor for this issue and monitoring your blood pressure at home.  Untreated high blood pressure can lead to kidney problems, heart disease, and risk of stroke and other medical issues.

## 2024-01-03 NOTE — ED Provider Notes (Signed)
 " [place] [facility] Provider Note   CSN: [id number] Arrival date & time: 01/03/24  0801     Patient presents with: Sciatica   Scott Novak is a 48 y.o. male presenting to the ED with 2 days of aching pain in his lower back radiating down his right leg.  There is worse with ambulating.  He denies any trauma or falls preceding this.  Reports had similar symptoms maybe 10 years ago that resolved.   HPI     Prior to Admission medications  Medication Sig Start Date End Date Taking? Authorizing Provider  metFORMIN  (GLUCOPHAGE ) 500 MG tablet Take 1 tablet (500 mg total) by mouth 2 (two) times daily with a meal. 03/04/16 04/03/16  Cardama, Raynell Moder, MD    Allergies: Patient has no known allergies.    Review of Systems  Updated Vital Signs BP (!) 150/101 (BP Location: Left Arm)   Pulse 84   Temp 98.2 F (36.8 C)   Resp 17   Wt 113.4 kg   SpO2 96%   Physical Exam Constitutional:      General: He is not in acute distress.    Appearance: He is obese.  HENT:     Head: Normocephalic and atraumatic.  Eyes:     Conjunctiva/sclera: Conjunctivae normal.     Pupils: Pupils are equal, round, and reactive to light.  Cardiovascular:     Rate and Rhythm: Normal rate and regular rhythm.  Pulmonary:     Effort: Pulmonary effort is normal. No respiratory distress.  Skin:    General: Skin is warm and dry.  Neurological:     General: No focal deficit present.     Mental Status: He is alert. Mental status is at baseline.     Comments: Positive straight leg test with left leg raise.  Patient is able to ambulate steadily in the ED, has no motor function deficits or weakness, no saddle anesthesia  Psychiatric:        Mood and Affect: Mood normal.        Behavior: Behavior normal.     (all labs ordered are listed, but only abnormal results are displayed) Labs Reviewed - No data to display  EKG: None  Radiology: No results  found.   Procedures   Medications Ordered in the ED - No data to display                                  Medical Decision Making  Patient is here with radiculopathy type pain down the right leg, to approximately the mid calf.  This may be related to a disc herniation, sciatica, nerve root impingement.  No red flags for cauda equina syndrome.  No motor weakness on exam.  No indication for emergent MRI.  No traumatic mechanism to raise concern for fracture, doubt malignancy with this history, I do not think we need emergent x-ray imaging of the spine.  I would recommend we start him on some prednisone  for a few days.  He will take ibuprofen at home.  If he has persistent back pain he can follow-up in orthopedic clinic in 3 or 4 weeks, but most of these cases do improve.  He and his wife verbalized understanding     Final diagnoses:  Acute right-sided low back pain with right-sided sciatica    ED Discharge Orders     None  Cottie Donnice PARAS, MD 01/03/24 [phone]  "

## 2024-01-03 NOTE — ED Triage Notes (Signed)
 Right lower back x yesterday , radiating to lower leg . Tingling to leg . Denies urinary symptoms .
